# Patient Record
Sex: Male | Born: 1975 | Race: Black or African American | Hispanic: No | Marital: Married | Smoking: Current every day smoker
Health system: Southern US, Community
[De-identification: ages and names within clinical notes are randomized; demographics above are authoritative.]

---

## 2016-12-31 ENCOUNTER — Emergency Department (HOSPITAL_COMMUNITY): Payer: Self-pay

## 2016-12-31 ENCOUNTER — Encounter (HOSPITAL_COMMUNITY): Payer: Self-pay | Admitting: Emergency Medicine

## 2016-12-31 ENCOUNTER — Emergency Department (HOSPITAL_COMMUNITY)
Admission: EM | Admit: 2016-12-31 | Discharge: 2016-12-31 | Disposition: A | Discharge status: Home / Self Care | Payer: Self-pay | Attending: Emergency Medicine | Admitting: Emergency Medicine

## 2016-12-31 DIAGNOSIS — X500XXA Overexertion from strenuous movement or load, initial encounter: Secondary | ICD-10-CM | POA: Insufficient documentation

## 2016-12-31 DIAGNOSIS — M25512 Pain in left shoulder: Secondary | ICD-10-CM | POA: Insufficient documentation

## 2016-12-31 DIAGNOSIS — F17210 Nicotine dependence, cigarettes, uncomplicated: Secondary | ICD-10-CM | POA: Insufficient documentation

## 2016-12-31 DIAGNOSIS — Y999 Unspecified external cause status: Secondary | ICD-10-CM | POA: Insufficient documentation

## 2016-12-31 DIAGNOSIS — Y929 Unspecified place or not applicable: Secondary | ICD-10-CM | POA: Insufficient documentation

## 2016-12-31 DIAGNOSIS — Y9389 Activity, other specified: Secondary | ICD-10-CM | POA: Insufficient documentation

## 2016-12-31 MED ORDER — NAPROXEN 500 MG PO TABS: 500.0000 mg | ORAL_TABLET | Freq: Two times a day (BID) | ORAL | 0 refills | Status: DC

## 2016-12-31 MED ORDER — IBUPROFEN 200 MG PO TABS
600.0000 mg | ORAL_TABLET | Freq: Once | ORAL | Status: AC
  Administered 2016-12-31: 600 mg via ORAL
  Filled 2016-12-31: qty 3

## 2016-12-31 NOTE — ED Provider Notes (Signed)
[facility]-EMERGENCY DEPT Provider Note   CSN: [id number] Arrival date & time: 12/31/16  4098     History   Chief Complaint Chief Complaint  Patient presents with  . Shoulder Pain    HPI Willie Arias is a 41 y.o. male.  HPI   Patient is a 41 year old male with no pertinent past medical history presents the ED with complaint of left shoulder pain. Patient reports having gradually worsening intermittent left shoulder pain for the past 8 months. 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 He note  having tenderness over the top of his left shoulder which she notes is worse with movement. He reports associated intermittent weakness to his left arm due to pain. He also states having a small area of swelling to the back of his left shoulder which she notes has been present for the past 4-5 years and has gradually increased in size. Denies fever, redness, numbness, tingling, chest pain, shortness of breath. History reviewed. No pertinent past medical history.  There are no active problems to display for this patient.   History reviewed. No pertinent surgical history.     Home Medications    Prior to Admission medications   Medication Sig Start Date End Date Taking? Authorizing Provider  acetaminophen (TYLENOL) 500 MG tablet Take 1,000 mg by mouth every 6 (six) hours as needed for mild pain or moderate pain.   Yes [provider]  naproxen (NAPROSYN) 500 MG tablet Take 1 tablet (500 mg total) by mouth 2 (two) times daily. 12/31/16   Barrett Henle, PA-C    Family History No family history on file.  Social History Social History  Substance Use Topics  . Smoking status: Current Every Day Smoker    Packs/day: 0.25    Types: Cigarettes  . Smokeless tobacco: Not on file  . Alcohol use No     Allergies   Patient has no known allergies.   Review of  Systems Review of Systems  Musculoskeletal: Positive for arthralgias (left shoulder).  Neurological: Positive for weakness.  All other systems reviewed and are negative.    Physical Exam Updated Vital Signs BP 115/78   Pulse 63   Temp 98.2 F (36.8 C) (Oral)   Resp 16   Ht 5\' 11"  (1.803 m)   Wt 197 lb 4 oz (89.5 kg)   SpO2 100%   BMI 27.51 kg/m   Physical Exam  Constitutional: He is oriented to person, place, and time. He appears well-developed and well-nourished. No distress.  HENT:  Head: Normocephalic and atraumatic.  Eyes: Conjunctivae and EOM are normal. Right eye exhibits no discharge. Left eye exhibits no discharge. No scleral icterus.  Neck: Normal range of motion. Neck supple.  Cardiovascular: Normal rate, regular rhythm, normal heart sounds and intact distal pulses.   Pulmonary/Chest: Effort normal and breath sounds normal.  Abdominal: Soft. He exhibits no distension.  Musculoskeletal: Normal range of motion. He exhibits tenderness. He exhibits no edema or deformity.       Left shoulder: He exhibits tenderness and bony tenderness (AC joint). He exhibits normal range of motion, no swelling, no effusion, no crepitus, no deformity, no laceration, no pain, normal pulse and normal strength.       Left elbow: Normal.       Left wrist: Normal.       Left upper arm: Normal.       Left forearm:  Normal.       Left hand: Normal.  TTP over left AC joint. Full passive ROM of left shoulder, dec active due to reported pain. 4/5 strength present to left shoulder. FROM of left arm, elbow, forearm, wrist and hand with 5/5 strength. Sensation intact. 2+ radial pulse. Cap refill <2. No swelling, erythema, warmth, deformity or step off noted. Clavicle nontender with no step off or deformity.   Neurological: He is alert and oriented to person, place, and time.  Skin: Skin is warm and dry. He is not diaphoretic.  3x2cm area of mobile, well defined mass present to left posterior shoulder  lateral to scapula. No surrounding swelling, redness, warmth. Nontender.  Nursing note and vitals reviewed.    ED Treatments / Results  Labs (all labs ordered are listed, but only abnormal results are displayed) Labs Reviewed - No data to display  EKG  EKG Interpretation None       Radiology Dg Shoulder Left  Result Date: 12/31/2016 CLINICAL DATA:  Left shoulder pain for months, worsening. EXAM: LEFT SHOULDER - 2+ VIEW COMPARISON:  None. FINDINGS: There is no evidence of fracture or dislocation. There is no evidence of arthropathy or other focal bone abnormality. Soft tissues are unremarkable. IMPRESSION: Negative. Electronically Signed   By: Marnee SpringJonathon  Watts M.D.   On: 12/31/2016 10:06    Procedures Procedures (including critical care time)  Medications Ordered in ED Medications  ibuprofen (ADVIL,MOTRIN) tablet 600 mg (600 mg Oral Given 12/31/16 0936)     Initial Impression / Assessment and Plan / ED Course  I have reviewed the triage vital signs and the nursing notes.  Pertinent labs & imaging results that were available during my care of the patient were reviewed by me and considered in my medical decision making (see chart for details).     Patient presents with gradually worsening left shoulder pain has been present for the past 8 months. Denies any specific injury but reports to having repetitive heavy lifting at his current jobs. VSS. Exam revealed tenderness over left AC joint without swelling, erythema or deformity. 4-5 strength present to left shoulder with decreased active range of motion due to reported pain. Area of swelling appears consistent with lipoma to left upper back; do not suspect abscess or infection. Advised to follow up with PCP. Patient X-Ray negative for obvious fracture or dislocation. Suspect pain may be due to rotator cuff injury. Pain managed in ED. Pt advised to follow up with orthopedics. Conservative therapy recommended and discussed. Patient  will be dc home & is agreeable with above plan.     Final Clinical Impressions(s) / ED Diagnoses   Final diagnoses:  Acute pain of left shoulder    New Prescriptions New Prescriptions   NAPROXEN (NAPROSYN) 500 MG TABLET    Take 1 tablet (500 mg total) by mouth 2 (two) times daily.     Barrett Henleadeau, Analia Zuk Elizabeth, PA-C 12/31/16 1055    Lorre NickAllen, Anthony, MD 01/01/17 1525

## 2016-12-31 NOTE — Discharge Instructions (Signed)
Take your medication as prescribed as for pain relief. I also recommend a senior shoulder for 15-20 minutes 3-4 times daily. I recommend refrain from doing any heavy lifting or straining his activity for the next week until your symptoms have improved. Follow-up with the orthopedic office listed below within the next week if your symptoms have not improved or worsened due to concern for possible rotator cuff tear.

## 2016-12-31 NOTE — ED Triage Notes (Signed)
Pt verbalizes worsening left shoulder pain for months with associated pain with lifting left arm and "lump" getting large to left scapula region.

## 2017-01-03 ENCOUNTER — Encounter (INDEPENDENT_AMBULATORY_CARE_PROVIDER_SITE_OTHER): Payer: Self-pay | Admitting: Family

## 2017-01-03 ENCOUNTER — Ambulatory Visit (INDEPENDENT_AMBULATORY_CARE_PROVIDER_SITE_OTHER): Payer: Self-pay | Admitting: Family

## 2017-01-03 VITALS — Ht 71.0 in | Wt 197.0 lb

## 2017-01-03 DIAGNOSIS — M7542 Impingement syndrome of left shoulder: Secondary | ICD-10-CM

## 2017-01-03 MED ORDER — LIDOCAINE HCL 1 % IJ SOLN
5.0000 mL | INTRAMUSCULAR | Status: AC | PRN
  Administered 2017-01-03: 5 mL

## 2017-01-03 MED ORDER — METHYLPREDNISOLONE ACETATE 40 MG/ML IJ SUSP
40.0000 mg | INTRAMUSCULAR | Status: AC | PRN
  Administered 2017-01-03: 40 mg via INTRA_ARTICULAR

## 2017-01-03 NOTE — Progress Notes (Signed)
Office Visit Note   Patient: Willie Arias           Date of Birth: 08/24/1975           MRN: [id number] Visit Date: 01/03/2017              Requested by: No referring provider defined for this encounter. PCP: Patient, No Pcp Per  Chief Complaint  Patient presents with  . Left Shoulder - Pain      HPI: The patient is a 41 year old gentleman who presents today for evaluation of left shoulder pain. This is chronic. This is been going on for at least 8 months. States that he presented to the emergency department on May 21. Was provided with naproxen which has helped a little bit. Complaining of decreased range of motion due to pain. Is able to lift his arm above his head. He states he is concerned about [address] [place] cuff.  Assessment & Plan: Visit Diagnoses:  1. Impingement syndrome of left shoulder     Plan: we will trial a Depo-Medrol injection.  recommended he continue with the naproxen. States he cannot complete physical therapy due to scheduling restrictions.  Follow-Up Instructions: Return in about 4 weeks (around 01/31/2017), or if symptoms worsen or fail to improve.   Left Shoulder Exam   Tenderness  The patient is experiencing tenderness in the biceps tendon and acromion.  Range of Motion  The patient has normal left shoulder ROM.  Muscle Strength  The patient has normal left shoulder strength.  Tests  Drop Arm: negative Impingement: positive      Patient is alert, oriented, no adenopathy, well-dressed, normal affect, normal respiratory effort.   Imaging: No results found.  Labs: No results found for: HGBA1C, ESRSEDRATE, CRP, LABURIC, REPTSTATUS, GRAMSTAIN, CULT, LABORGA  Orders:  No orders of the defined types were placed in this encounter.  No orders of the defined types were placed in this encounter.    Procedures: Large Joint Inj Date/Time: 01/03/2017 4:32 PM Performed by: Adonis HugueninZAMORA, Ioana Louks R Authorized by: Barnie DelZAMORA, Aleeya Veitch R   Consent Given  by:  Patient Site marked: the procedure site was marked   Timeout: prior to procedure the correct patient, procedure, and site was verified   Indications:  Pain and diagnostic evaluation Location:  Shoulder Site:  R subacromial bursa Prep: patient was prepped and draped in usual sterile fashion   Needle Size:  22 G Needle Length:  1.5 inches Ultrasound Guidance: No   Fluoroscopic Guidance: No   Arthrogram: No   Medications:  5 mL lidocaine 1 %; 40 mg methylPREDNISolone acetate 40 MG/ML Aspiration Attempted: No   Patient tolerance:  Patient tolerated the procedure well with no immediate complications    Clinical Data: No additional findings.  ROS:  All other systems negative, except as noted in the HPI. Review of Systems  Constitutional: Negative for chills and fever.  Musculoskeletal: Positive for arthralgias. Negative for myalgias.    Objective: Vital Signs: Ht 5\' 11"  (1.803 m)   Wt 197 lb (89.4 kg)   BMI 27.48 kg/m   Specialty Comments:  No specialty comments available.  PMFS History: There are no active problems to display for this patient.  No past medical history on file.  No family history on file.  No past surgical history on file. Social History   Occupational History  . Not on file.   Social History Main Topics  . Smoking status: Current Every Day Smoker    Packs/day: 0.25  Types: Cigarettes  . Smokeless tobacco: Never Used  . Alcohol use No  . Drug use: No  . Sexual activity: Not on file

## 2017-01-25 ENCOUNTER — Ambulatory Visit
Admission: RE | Admit: 2017-01-25 | Discharge: 2017-01-25 | Disposition: A | Discharge status: Home / Self Care | Payer: No Typology Code available for payment source | Source: Ambulatory Visit | Attending: Family | Admitting: Family

## 2017-01-25 DIAGNOSIS — M7542 Impingement syndrome of left shoulder: Secondary | ICD-10-CM

## 2017-01-31 ENCOUNTER — Encounter (INDEPENDENT_AMBULATORY_CARE_PROVIDER_SITE_OTHER): Payer: Self-pay | Admitting: Orthopedic Surgery

## 2017-01-31 ENCOUNTER — Ambulatory Visit (INDEPENDENT_AMBULATORY_CARE_PROVIDER_SITE_OTHER): Payer: Self-pay | Admitting: Orthopedic Surgery

## 2017-01-31 DIAGNOSIS — M25512 Pain in left shoulder: Secondary | ICD-10-CM

## 2017-01-31 NOTE — Progress Notes (Signed)
Office Visit Note   Patient: Willie Arias           Date of Birth: 12/15/1975           MRN: [id number] Visit Date: 01/31/2017 Requested by: No referring provider defined for this encounter. PCP: Patient, No Pcp Per  Subjective: Chief Complaint  Patient presents with  . Left Shoulder - Pain    HPI: Stancil is a 41 year old patient with left shoulder pain.  Denies any history of injury.  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  Shoulder pain is getting worse on the left-hand side.  Localizes the pain to the superior aspect of the shoulder.  States he has a lipoma posterior lateral aspect around the acromion.  He has been working but is been lifting less than 10 pounds.  His heart in the sleep on that left side.  He is right-hand-dominant.              ROS: All systems reviewed are negative as they relate to the chief complaint within the history of present illness.  Patient denies  fevers or chills.   Assessment & Plan: Visit Diagnoses:  1. 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  This scan is reviewed with the patient.  Plan at this time is for topical anti-inflammatory to be tried.  Prescription sent and samples provided.  Next step would be an injection into the acromioclavicular joint under ultrasound guidance.  We're considering doing that today but he is too concerned about things and needles for that intervention today.  I don't think that there is any pressing need for surgical intervention unless he fails nonoperative measures.  Should the anti-inflammatory topical not worked in injection would be the next step.  He'll return for that.  I'll see him back as needed  Follow-Up Instructions:  Return if symptoms worsen or fail to improve.   Orders:  No orders of the defined types were placed in this encounter.  No orders of the defined types were placed in this encounter.     Procedures: No procedures performed   Clinical Data: No additional findings.  Objective: Vital Signs: There were no vitals taken for this visit.  Physical Exam:   Constitutional: Patient appears well-developed HEENT:  Head: Normocephalic Eyes:EOM are normal Neck: Normal range of motion Cardiovascular: Normal rate Pulmonary/chest: Effort normal Neurologic: Patient is alert Skin: Skin is warm Psychiatric: Patient has normal mood and affect    Ortho Exam: Left shoulder exam demonstrates full active and passive range of motion but with fairly significant acromioclavicular joint tenderness grinding and crepitus on the left compared to the right.  Rotator cuff strength is intact.  Radial pulses intact.  He does have pain with crossarm abduction but good rotator cuff strength isolated infraspinatus supraspinatus and subscap muscle testing patient has no restriction of external rotation at 15 abduction left versus right no other masses lymph adenopathy or skin changes noted in the shoulder girdle region.  Specialty Comments:  No specialty comments available.  Imaging: No results found.   PMFS History: There are no active problems to display for this patient.  No past medical history on file.  No family history on file.  No past surgical history  on file. Social History   Occupational History  . Not on file.   Social History Main Topics  . Smoking status: Current Every Day Smoker    Packs/day: 0.25    Types: Cigarettes  . Smokeless tobacco: Never Used  . Alcohol use No  . Drug use: No  . Sexual activity: Not on file

## 2017-02-28 ENCOUNTER — Ambulatory Visit (INDEPENDENT_AMBULATORY_CARE_PROVIDER_SITE_OTHER): Payer: Self-pay | Admitting: Orthopedic Surgery

## 2017-03-28 ENCOUNTER — Emergency Department (HOSPITAL_COMMUNITY): Payer: No Typology Code available for payment source

## 2017-03-28 ENCOUNTER — Encounter (HOSPITAL_COMMUNITY): Payer: Self-pay | Admitting: Emergency Medicine

## 2017-03-28 ENCOUNTER — Emergency Department (HOSPITAL_COMMUNITY)
Admission: EM | Admit: 2017-03-28 | Discharge: 2017-03-28 | Disposition: A | Discharge status: Home / Self Care | Payer: No Typology Code available for payment source | Attending: Emergency Medicine | Admitting: Emergency Medicine

## 2017-03-28 DIAGNOSIS — Z79899 Other long term (current) drug therapy: Secondary | ICD-10-CM | POA: Insufficient documentation

## 2017-03-28 DIAGNOSIS — Y9389 Activity, other specified: Secondary | ICD-10-CM | POA: Diagnosis not present

## 2017-03-28 DIAGNOSIS — Y92410 Unspecified street and highway as the place of occurrence of the external cause: Secondary | ICD-10-CM | POA: Diagnosis not present

## 2017-03-28 DIAGNOSIS — M25532 Pain in left wrist: Secondary | ICD-10-CM | POA: Insufficient documentation

## 2017-03-28 DIAGNOSIS — Y999 Unspecified external cause status: Secondary | ICD-10-CM | POA: Diagnosis not present

## 2017-03-28 DIAGNOSIS — F17210 Nicotine dependence, cigarettes, uncomplicated: Secondary | ICD-10-CM | POA: Diagnosis not present

## 2017-03-28 MED ORDER — MELOXICAM 7.5 MG PO TABS: 7.5000 mg | ORAL_TABLET | Freq: Every day | ORAL | 0 refills | Status: DC
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
## 2017-03-28 NOTE — ED Notes (Signed)
Bed: WTR6 Expected date:  Expected time:  Means of arrival:  Comments: 

## 2017-03-28 NOTE — ED Notes (Signed)
Bed: WTR5 Expected date:  Expected time:  Means of arrival:  Comments: 

## 2017-03-28 NOTE — Discharge Instructions (Signed)
Take Mobic once a day. Do not take other anti-inflammatories at the same time (Advil, Motrin, naproxen, Aleve, ibuprofen). You may supplement with Tylenol as needed. You may use the Ace wrap on your wrist as needed for support and pain control. Rest and ice your wrist as much as possible, icing it for 20 minutes at a time multiple times a day. Follow-up with [facility] in one week if you're still experiencing pain. Turned to the emergency room if you develop vision changes, numbness, weakness, vomiting, or any new or worsening symptoms.

## 2018-02-15 DIAGNOSIS — K029 Dental caries, unspecified: Secondary | ICD-10-CM | POA: Insufficient documentation

## 2018-02-15 DIAGNOSIS — F17210 Nicotine dependence, cigarettes, uncomplicated: Secondary | ICD-10-CM | POA: Insufficient documentation

## 2018-02-15 DIAGNOSIS — K047 Periapical abscess without sinus: Secondary | ICD-10-CM | POA: Insufficient documentation

## 2018-02-16 ENCOUNTER — Other Ambulatory Visit: Payer: Self-pay

## 2018-02-16 ENCOUNTER — Encounter (HOSPITAL_COMMUNITY): Payer: Self-pay | Admitting: *Deleted

## 2018-02-16 ENCOUNTER — Emergency Department (HOSPITAL_COMMUNITY)
Admission: EM | Admit: 2018-02-16 | Discharge: 2018-02-16 | Disposition: A | Discharge status: Home / Self Care | Payer: No Typology Code available for payment source | Attending: Emergency Medicine | Admitting: Emergency Medicine

## 2018-02-16 DIAGNOSIS — K047 Periapical abscess without sinus: Secondary | ICD-10-CM

## 2018-02-16 DIAGNOSIS — K029 Dental caries, unspecified: Secondary | ICD-10-CM

## 2018-02-16 MED ORDER — CLINDAMYCIN HCL 150 MG PO CAPS: 450.0000 mg | ORAL_CAPSULE | Freq: Three times a day (TID) | ORAL | 0 refills | Status: DC

## 2018-02-16 MED ORDER — ACETAMINOPHEN 325 MG PO TABS
325.0000 mg | ORAL_TABLET | Freq: Once | ORAL | Status: AC
  Administered 2018-02-16: 325 mg via ORAL
  Filled 2018-02-16: qty 1

## 2018-02-16 MED ORDER — CLINDAMYCIN HCL 300 MG PO CAPS
450.0000 mg | ORAL_CAPSULE | Freq: Once | ORAL | Status: AC
  Administered 2018-02-16: 04:00:00 450 mg via ORAL
  Filled 2018-02-16: qty 1

## 2018-02-16 MED ORDER — BUPIVACAINE-EPINEPHRINE (PF) 0.5% -1:200000 IJ SOLN
1.8000 mL | Freq: Once | INTRAMUSCULAR | Status: AC
  Administered 2018-02-16: 1.8 mL
  Filled 2018-02-16: qty 1.8

## 2018-02-16 MED ORDER — OXYCODONE-ACETAMINOPHEN 5-325 MG PO TABS
2.0000 | ORAL_TABLET | Freq: Once | ORAL | Status: AC
  Administered 2018-02-16: 2 via ORAL
  Filled 2018-02-16: qty 2

## 2018-02-16 NOTE — ED Triage Notes (Signed)
Pt c/o abscess to right lower jaw that began on Wednesday.  Facial edema began Saturday.

## 2018-02-16 NOTE — ED Provider Notes (Signed)
[facility]-EMERGENCY DEPT Provider Note   CSN: [id number] Arrival date & time: 02/15/18  2355     History   Chief Complaint Chief Complaint  Patient presents with  . Abscess    dental    HPI Willie Riley LamDouglas [name] is a 42 y.o. male with a hx of no major medical problems presents to the Emergency Department complaining of gradual, persistent, progressively worsening right lower dental pain onset 5 days ago. Associated symptoms include facial swelling and increased pain today.  Pt has been taking ibuprofen with relief until tonight.  Pt denise fever, chills, headache, neck pain, neck stiffness, difficulty breathing, difficulty swallowing.  Pt reports eating and drinking makes the pain worse.  Pt does have a dentist.  He was seen approx 1 mo ago with recommendation for tooth extraction, but patient was waiting.     The history is provided by the patient, medical records and a significant other. No language interpreter was used.    History reviewed. No pertinent past medical history.  There are no active problems to display for this patient.   History reviewed. No pertinent surgical history.      Home Medications    Prior to Admission medications   Medication Sig Start Date End Date Taking? Authorizing Provider  ibuprofen (ADVIL,MOTRIN) 400 MG tablet Take 800-1,200 mg by mouth every 6 (six) hours as needed for moderate pain.   Yes [provider]  clindamycin (CLEOCIN) 150 MG capsule Take 3 capsules (450 mg total) by mouth 3 (three) times daily. 02/16/18   Nariah Morgano, Dahlia ClientHannah, PA-C    Family History No family history on file.  Social History Social History   Tobacco Use  . Smoking status: Current Every Day Smoker    Packs/day: 0.25    Types: Cigarettes  . Smokeless tobacco: Never Used  Substance Use Topics  . Alcohol use: Yes    Comment: weekends  . Drug use: No     Allergies   Patient has no known allergies.   Review of  Systems Review of Systems  Constitutional: Negative for appetite change, chills and fever.  HENT: Positive for dental problem and facial swelling. Negative for drooling, ear pain, nosebleeds, postnasal drip, rhinorrhea and trouble swallowing.   Eyes: Negative for pain and redness.  Respiratory: Negative for cough and wheezing.   Cardiovascular: Negative for chest pain.  Gastrointestinal: Negative for abdominal pain, nausea and vomiting.  Musculoskeletal: Negative for neck pain and neck stiffness.  Skin: Negative for color change and rash.  Neurological: Positive for headaches. Negative for weakness and light-headedness.  All other systems reviewed and are negative.    Physical Exam Updated Vital Signs BP (!) 147/80   Pulse 66   Temp 99.3 F (37.4 C) (Oral) Comment: last took ibuprofen @ 18:00  Resp 15   Ht 5\' 10"  (1.778 m)   Wt 93.9 kg (207 lb)   SpO2 100%   BMI 29.70 kg/m   Physical Exam  Constitutional: He appears well-developed and well-nourished.  HENT:  Head: Normocephalic.  Right Ear: Tympanic membrane, external ear and ear canal normal.  Left Ear: Tympanic membrane, external ear and ear canal normal.  Nose: Nose normal. Right sinus exhibits no maxillary sinus tenderness and no frontal sinus tenderness. Left sinus exhibits no maxillary sinus tenderness and no frontal sinus tenderness.  Mouth/Throat: Uvula is midline, oropharynx is clear and moist and mucous membranes are normal. No oral lesions. Abnormal dentition. Dental caries present. No uvula swelling or lacerations. No  oropharyngeal exudate, posterior oropharyngeal edema, posterior oropharyngeal erythema or tonsillar abscesses.  Multiple dental caries throughout.  Significant tenderness to palpation of tooth #27 with evidence of abscess.  Swelling and fluctuance between the gingiva and buccal mucosa. No fluctuance or induration to the floor of the mouth  Eyes: Pupils are equal, round, and reactive to light.  Conjunctivae are normal. Right eye exhibits no discharge. Left eye exhibits no discharge.  Neck: Normal range of motion. Neck supple.  No stridor Handling secretions without difficulty No nuchal rigidity No cervical lymphadenopathy  Cardiovascular: Normal rate, regular rhythm and normal heart sounds.  Pulmonary/Chest: Effort normal. No respiratory distress.  Equal chest rise  Abdominal: Soft. Bowel sounds are normal. He exhibits no distension. There is no tenderness.  Lymphadenopathy:       Head (right side): Submental, submandibular and tonsillar adenopathy present.       Head (left side): No submental, no submandibular and no tonsillar adenopathy present.    He has no cervical adenopathy.  Neurological: He is alert.  Skin: Skin is warm and dry.  Psychiatric: He has a normal mood and affect.  Nursing note and vitals reviewed.    ED Treatments / Results   Procedures .Marland KitchenIncision and Drainage Date/Time: 02/16/2018 5:55 AM Performed by: Dierdre Forth, PA-C Authorized by: Dierdre Forth, PA-C   Consent:    Consent obtained:  Verbal   Consent given by:  Patient   Risks discussed:  Bleeding, incomplete drainage, pain, infection and damage to other organs   Alternatives discussed:  No treatment Location:    Type:  Abscess   Location:  Mouth   Mouth location:  Alveolar process Anesthesia (see MAR for exact dosages):    Anesthesia method:  Local infiltration   Local anesthetic:  Bupivacaine 0.5% WITH epi Procedure type:    Complexity:  Simple Procedure details:    Needle aspiration: no     Incision types:  Single straight   Scalpel blade:  11   Wound management:  Probed and deloculated and irrigated with saline   Drainage:  Bloody and purulent   Drainage amount:  Moderate   Wound treatment:  Wound left open   Packing materials:  None Post-procedure details:    Patient tolerance of procedure:  Tolerated well, no immediate complications   (including critical care  time)  Medications Ordered in ED Medications  clindamycin (CLEOCIN) capsule 450 mg (450 mg Oral Given 02/16/18 0340)  oxyCODONE-acetaminophen (PERCOCET/ROXICET) 5-325 MG per tablet 2 tablet (2 tablets Oral Given 02/16/18 0340)  bupivacaine-epinephrine (MARCAINE W/ EPI) 0.5% -1:200000 injection 1.8 mL (1.8 mLs Infiltration Given 02/16/18 0342)     Initial Impression / Assessment and Plan / ED Course  I have reviewed the triage vital signs and the nursing notes.  Pertinent labs & imaging results that were available during my care of the patient were reviewed by me and considered in my medical decision making (see chart for details).     Patient presents with dental pain and swelling.  His he is afebrile without tachycardia.  No evidence of sepsis.  No evidence of peritonsillar abscess, Ludwig angina or retropharyngeal abscess.  He is tolerating secretions and p.o. fluids before and after incision and drainage.  Patient does have a fluctuant abscess to the right lower gingiva.  Incision and drainage without complication.  Patient will be discharged home on clindamycin.  Patient noted to be hypertensive in the emergency department.  I suspect this is secondary to his pain.  No signs of  hypertensive urgency.  Discussed with patient the need for close follow-up and management by their primary care physician.    Final Clinical Impressions(s) / ED Diagnoses   Final diagnoses:  Dental abscess  Pain due to dental caries    ED Discharge Orders        Ordered    clindamycin (CLEOCIN) 150 MG capsule  3 times daily     02/16/18 0600       Algie Cales, Boyd Kerbs 02/16/18 0601    Melene Plan, DO 02/16/18 1610

## 2018-02-16 NOTE — Discharge Instructions (Addendum)
1. Medications: Clindamycin, alterate tylenol and ibuprofen for pain control, usual home medications 2. Treatment: rest, drink plenty of fluids, take medications as prescribed 3. Follow Up: Please followup with dentistry within 1 week for discussion of your diagnoses and further evaluation after today's visit; if you do not have a primary care doctor use the resource guide provided to find one; Return to the ER for high fevers, difficulty breathing, difficulty swallowing or other concerning symptoms
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
## 2019-09-29 ENCOUNTER — Emergency Department (HOSPITAL_COMMUNITY)
Admission: EM | Admit: 2019-09-29 | Discharge: 2019-09-29 | Disposition: A | Discharge status: Home / Self Care | Payer: No Typology Code available for payment source | Attending: Emergency Medicine | Admitting: Emergency Medicine

## 2019-09-29 ENCOUNTER — Other Ambulatory Visit: Payer: Self-pay

## 2019-09-29 ENCOUNTER — Encounter (HOSPITAL_COMMUNITY): Payer: Self-pay | Admitting: Emergency Medicine

## 2019-09-29 DIAGNOSIS — F17210 Nicotine dependence, cigarettes, uncomplicated: Secondary | ICD-10-CM | POA: Insufficient documentation

## 2019-09-29 DIAGNOSIS — H9201 Otalgia, right ear: Secondary | ICD-10-CM

## 2019-09-29 DIAGNOSIS — L0291 Cutaneous abscess, unspecified: Secondary | ICD-10-CM

## 2019-09-29 DIAGNOSIS — Z23 Encounter for immunization: Secondary | ICD-10-CM | POA: Insufficient documentation

## 2019-09-29 DIAGNOSIS — H6001 Abscess of right external ear: Secondary | ICD-10-CM | POA: Insufficient documentation

## 2019-09-29 MED ORDER — LIDOCAINE HCL (PF) 1 % IJ SOLN
5.0000 mL | Freq: Once | INTRAMUSCULAR | Status: AC
  Administered 2019-09-29: 5 mL
  Filled 2019-09-29: qty 30

## 2019-09-29 MED ORDER — OXYCODONE-ACETAMINOPHEN 5-325 MG PO TABS
1.0000 | ORAL_TABLET | Freq: Once | ORAL | Status: AC
  Administered 2019-09-29: 1 via ORAL
  Filled 2019-09-29: qty 1

## 2019-09-29 MED ORDER — TETANUS-DIPHTH-ACELL PERTUSSIS 5-2.5-18.5 LF-MCG/0.5 IM SUSP
0.5000 mL | Freq: Once | INTRAMUSCULAR | Status: AC
  Administered 2019-09-29: 20:00:00 0.5 mL via INTRAMUSCULAR
  Filled 2019-09-29: qty 0.5

## 2019-09-29 MED ORDER — DOXYCYCLINE HYCLATE 100 MG PO CAPS: 100.0000 mg | ORAL_CAPSULE | Freq: Two times a day (BID) | ORAL | 0 refills | Status: DC

## 2019-09-29 MED ORDER — DOXYCYCLINE HYCLATE 100 MG PO CAPS: 100.0000 mg | ORAL_CAPSULE | Freq: Two times a day (BID) | ORAL | 0 refills | Status: AC

## 2019-09-29 NOTE — ED Triage Notes (Signed)
Pt c/o right ear lobe swelling and pain for couple days. Reports some brownish discharge coming out the other day when squeezed it.

## 2019-09-29 NOTE — Discharge Instructions (Addendum)
Please take doxycycline as prescribed.  You will take this twice daily.  Please take Tylenol or Profen as we discussed.  I have suggested dosages below.  Please use Tylenol or ibuprofen for pain.  You may use 600 mg ibuprofen every 6 hours or 1000 mg of Tylenol every 6 hours.  You may choose to alternate between the 2.  This would be most effective.  Not to exceed 4 g of Tylenol within 24 hours.  Not to exceed 3200 mg ibuprofen 24 hours.   Please continue to do warm compresses at least 3 times a day.

## 2019-09-29 NOTE — ED Provider Notes (Signed)
[facility]-EMERGENCY DEPT Provider Note   CSN: [id number] Arrival date & time: 09/29/19  1811     History Chief Complaint  Patient presents with  . ear lobe swelling    right    Willie Archuleta [name] is a 44 y.o. male.  HPI  Patient presents for right ear pain.  Patient describes pain as constant, worsening, achy right ear pain that is severe, worse with touch and warm to touch.  This has been present since 2 days ago--patient states that history of abscesses other than dental abscess one year ago. Pt states no fevers, fatigue, weakness, headache, dizziness.   Patient states that he has no medical problems such as diabetes, HIV or chronic steroid use.     History reviewed. No pertinent past medical history.  There are no problems to display for this patient.   History reviewed. No pertinent surgical history.     No family history on file.  Social History   Tobacco Use  . Smoking status: Current Every Day Smoker    Packs/day: 0.25    Types: Cigarettes  . Smokeless tobacco: Never Used  Substance Use Topics  . Alcohol use: Yes    Comment: weekends  . Drug use: No    Home Medications Prior to Admission medications   Medication Sig Start Date End Date Taking? Authorizing Provider  clindamycin (CLEOCIN) 150 MG capsule Take 3 capsules (450 mg total) by mouth 3 (three) times daily. 02/16/18   Muthersbaugh, Dahlia Client, PA-C  doxycycline (VIBRAMYCIN) 100 MG capsule Take 1 capsule (100 mg total) by mouth 2 (two) times daily for 7 days. 09/29/19 10/06/19  Gailen Shelter, PA  ibuprofen (ADVIL,MOTRIN) 400 MG tablet Take 800-1,200 mg by mouth every 6 (six) hours as needed for moderate pain.    [provider]    Allergies    Patient has no known allergies.  Review of Systems   Review of Systems  Constitutional: Negative for chills, fatigue and fever.  HENT: Positive for ear pain and facial swelling (Swelling of ear). Negative for congestion.     Respiratory: Negative for shortness of breath.   Cardiovascular: Negative for chest pain.  Gastrointestinal: Negative for abdominal pain.  Musculoskeletal: Negative for neck pain.    Physical Exam Updated Vital Signs BP 136/89 (BP Location: Right Arm)   Pulse 69   Temp 98.2 F (36.8 C)   Resp 16   SpO2 100%   Physical Exam Vitals and nursing note reviewed.  Constitutional:      General: He is not in acute distress.    Appearance: Normal appearance. He is not ill-appearing.  HENT:     Head: Normocephalic and atraumatic.  Eyes:     General: No scleral icterus.       Right eye: No discharge.        Left eye: No discharge.     Conjunctiva/sclera: Conjunctivae normal.  Neck:     Comments: No anterior or posterior cervical lymphadenopathy Pulmonary:     Effort: Pulmonary effort is normal.     Breath sounds: No stridor.  Skin:    Comments: Right ear with swelling, redness and TTP of the lobule. The area is fluctuant.   Neurological:     Mental Status: He is alert and oriented to person, place, and time. Mental status is at baseline.           ED Results / Procedures / Treatments   Labs (all labs ordered are listed, but only  abnormal results are displayed) Labs Reviewed - No data to display  EKG None  Radiology No results found.  Procedures .Marland KitchenIncision and Drainage  Date/Time: 09/29/2019 9:05 PM Performed by: Tedd Sias, PA Authorized by: Tedd Sias, PA   Consent:    Consent obtained:  Verbal   Consent given by:  Patient   Risks discussed:  Bleeding, incomplete drainage, pain and damage to other organs   Alternatives discussed:  No treatment Universal protocol:    Procedure explained and questions answered to patient or proxy's satisfaction: yes     Relevant documents present and verified: yes     Test results available and properly labeled: yes     Imaging studies available: yes     Required blood products, implants, devices, and special  equipment available: yes     Site/side marked: yes     Immediately prior to procedure a time out was called: yes     Patient identity confirmed:  Verbally with patient Location:    Type:  Abscess   Size:  1.5 cm   Location:  Head   Head location:  R external ear Pre-procedure details:    Skin preparation:  Betadine and Chloraprep Anesthesia (see MAR for exact dosages):    Anesthesia method:  Local infiltration   Local anesthetic:  Lidocaine 1% WITH epi Procedure type:    Complexity:  Simple Procedure details:    Needle aspiration: yes     Needle size:  18 G   Wound management:  Irrigated with saline and extensive cleaning (2 cc were injected into lobule abscess and then reaspirated.)   Drainage:  Purulent (2 cc of purulent drainage)   Drainage amount:  Moderate   Wound treatment:  Wound left open   Packing materials:  None Post-procedure details:    Patient tolerance of procedure:  Tolerated well, no immediate complications Comments:     Patient tolerated procedure well.    (including critical care time)  Medications Ordered in ED Medications  oxyCODONE-acetaminophen (PERCOCET/ROXICET) 5-325 MG per tablet 1 tablet (1 tablet Oral Given 09/29/19 1940)  Tdap (BOOSTRIX) injection 0.5 mL (0.5 mLs Intramuscular Given 09/29/19 1941)  lidocaine (PF) (XYLOCAINE) 1 % injection 5 mL (5 mLs Infiltration Given by Other 09/29/19 1941)    ED Course  I have reviewed the triage vital signs and the nursing notes.  Pertinent labs & imaging results that were available during my care of the patient were reviewed by me and considered in my medical decision making (see chart for details).    MDM Rules/Calculators/A&P                      Patient is well-appearing 44 year old male with a abscess to the right ear.  He states it has been worsening over the past 2 days.  He denies any systemic symptoms and his vitals within normal limits.  He has reassuring physical exam apart from his right ear  abscess.  He has noticed cervical lymphadenopathy.  No meningismus or other concerning symptoms are due to his infection.  He has no change in his hearing.  Discussed pros and cons of incision and drainage versus needle aspiration.  Patient states that he would prefer to have needle aspiration.  I aspirated 2 cc of purulent fluid from the lobule of his earlobe.  I irrigated with 2 cc of saline and we aspirated 2 cc back.  I provided him with doxycycline which she will take for the next  7 days.  He will follow-up with ENT as this was unusual place for him to have infection.  I made this referral at the recommendation of Dr. Madilyn Hook with whom I discussed presentation, physical exam, vitals and history of this patient.  I made changes my plan is recommended by Dr. Madilyn Hook.  Discussed with patient's wife over the phone plan of care and discharge instructions.  He is understanding of plan and well-appearing this time.  Vitals are within normal limits the entirety of his visit.   Final Clinical Impression(s) / ED Diagnoses Final diagnoses:  Abscess  Right ear pain    Rx / DC Orders ED Discharge Orders         Ordered    doxycycline (VIBRAMYCIN) 100 MG capsule  2 times daily,   Status:  Discontinued     09/29/19 2012    doxycycline (VIBRAMYCIN) 100 MG capsule  2 times daily     09/29/19 2059           Solon Augusta O'Neill, [place] 09/29/19 2110    Tilden Fossa, MD 09/30/19 [phone]

## 2019-11-11 ENCOUNTER — Other Ambulatory Visit: Payer: Self-pay | Admitting: Otolaryngology

## 2019-11-11 DIAGNOSIS — Q180 Sinus, fistula and cyst of branchial cleft: Secondary | ICD-10-CM

## 2019-12-18 ENCOUNTER — Encounter: Payer: Self-pay | Admitting: Gastroenterology

## 2020-01-20 ENCOUNTER — Ambulatory Visit: Payer: No Typology Code available for payment source | Admitting: Internal Medicine

## 2021-01-21 ENCOUNTER — Emergency Department (HOSPITAL_COMMUNITY)
Admission: EM | Admit: 2021-01-21 | Discharge: 2021-01-21 | Disposition: A | Discharge status: Home / Self Care | Payer: No Typology Code available for payment source | Attending: Emergency Medicine | Admitting: Emergency Medicine

## 2021-01-21 ENCOUNTER — Other Ambulatory Visit: Payer: Self-pay

## 2021-01-21 ENCOUNTER — Encounter (HOSPITAL_COMMUNITY): Payer: Self-pay

## 2021-01-21 DIAGNOSIS — F17210 Nicotine dependence, cigarettes, uncomplicated: Secondary | ICD-10-CM | POA: Insufficient documentation

## 2021-01-21 DIAGNOSIS — K029 Dental caries, unspecified: Secondary | ICD-10-CM | POA: Insufficient documentation

## 2021-01-21 DIAGNOSIS — R202 Paresthesia of skin: Secondary | ICD-10-CM | POA: Insufficient documentation

## 2021-01-21 DIAGNOSIS — K047 Periapical abscess without sinus: Secondary | ICD-10-CM | POA: Insufficient documentation

## 2021-01-21 MED ORDER — AMOXICILLIN-POT CLAVULANATE 875-125 MG PO TABS: 1.0000 | ORAL_TABLET | Freq: Two times a day (BID) | ORAL | 0 refills | Status: DC

## 2021-01-21 MED ORDER — ACETAMINOPHEN 500 MG PO TABS
1000.0000 mg | ORAL_TABLET | Freq: Once | ORAL | Status: AC
  Administered 2021-01-21: 1000 mg via ORAL
  Filled 2021-01-21: qty 2

## 2021-01-21 MED ORDER — HYDROCODONE-ACETAMINOPHEN 5-325 MG PO TABS: 1.0000 | ORAL_TABLET | Freq: Four times a day (QID) | ORAL | 0 refills | Status: DC | PRN

## 2021-01-21 MED ORDER — AMOXICILLIN-POT CLAVULANATE 875-125 MG PO TABS
1.0000 | ORAL_TABLET | Freq: Once | ORAL | Status: AC
  Administered 2021-01-21: 1 via ORAL
  Filled 2021-01-21: qty 1

## 2021-01-21 MED ORDER — AMOXICILLIN-POT CLAVULANATE 875-125 MG PO TABS: 1.0000 | ORAL_TABLET | Freq: Two times a day (BID) | ORAL | 0 refills | Status: AC

## 2021-01-21 MED ORDER — OXYCODONE HCL 5 MG PO TABS
5.0000 mg | ORAL_TABLET | Freq: Once | ORAL | Status: AC
  Administered 2021-01-21: 5 mg via ORAL
  Filled 2021-01-21: qty 1

## 2021-01-21 MED ORDER — HYDROCODONE-ACETAMINOPHEN 5-325 MG PO TABS: 1.0000 | ORAL_TABLET | Freq: Four times a day (QID) | ORAL | 0 refills | Status: AC | PRN

## 2021-01-21 NOTE — Discharge Instructions (Signed)
Please read and follow all provided instructions.  Your diagnoses today include:  1. Dental infection     The exam and treatment you received today has been provided on an emergency basis only. This is not a substitute for complete medical or dental care.  Tests performed today include: Vital signs. See below for your results today.   Medications prescribed:  Augmentin - antibiotic  You have been prescribed an antibiotic medicine: take the entire course of medicine even if you are feeling better. Stopping early can cause the antibiotic not to work.  Vicodin (hydrocodone/acetaminophen) - narcotic pain medication  DO NOT drive or perform any activities that require you to be awake and alert because this medicine can make you drowsy. BE VERY CAREFUL not to take multiple medicines containing Tylenol (also called acetaminophen). Doing so can lead to an overdose which can damage your liver and cause liver failure and possibly death.   Take any prescribed medications only as directed.  Home care instructions:  Follow any educational materials contained in this packet.  Follow-up instructions: Please follow-up with your dentist for further evaluation of your symptoms.   Dental Assistance: See attached dental referral and/or resource guide.   Return instructions:  Please return to the Emergency Department if you experience worsening symptoms. Please return if you develop a fever, you develop more swelling in your face or neck, you have trouble breathing or swallowing food. Please return if you have any other emergent concerns.  Additional Information:  Your vital signs today were: BP (!) 151/119 (BP Location: Right Arm)   Pulse (!) 56   Temp 100.3 F (37.9 C) (Oral)   Resp 16   Ht 5\' 11"  (1.803 m)   Wt 99.8 kg   SpO2 98%   BMI 30.68 kg/m  If your blood pressure (BP) was elevated above 135/85 this visit, please have this repeated by your doctor within one  month. --------------

## 2021-01-21 NOTE — ED Provider Notes (Signed)
Emergency Medicine Provider Triage Evaluation Note  [name] , a 45 y.o. male  was evaluated in triage.  Pt complains of dental pain.  He chipped a tooth on his bottom right jaw 2 days ago.  Since then he has had fevers and chills.  He is also complaining of swelling to the right of his lower jaw.  He also is having some tingling to his lips and that he is less able to feel the right side of his mouth.  He has not noticed any drainage from the tooth, but it is extremely painful.  Review of Systems  Positive: Pain, fever, chills, facial numbness, facial swelling Negative: Trauma, nausea, vomiting  Physical Exam  BP (!) 146/103 (BP Location: Right Arm)   Pulse 62   Temp 100.3 F (37.9 C) (Oral)   Resp 16   Ht 5\' 11"  (1.803 m)   Wt 99.8 kg   SpO2 100%   BMI 30.68 kg/m  Gen:   Awake, no distress   Resp:  Normal effort  MSK:   Moves extremities without difficulty  Other:  Tooth is fractured, part of it is still in the socket.  He does have some induration to the right part of his jaw.  He is extremely tender to palpation, but there is no sublingual tenderness or swelling.  I am unable to appreciate an abscess in the socket..  Medical Decision Making  Medically screening exam initiated at 7:37 PM.  Appropriate orders placed.  Willie Arias [name] was informed that the remainder of the evaluation will be completed by another provider, this initial triage assessment does not replace that evaluation, and the importance of remaining in the ED until their evaluation is complete.    [place], PA-C 01/21/21 1940    03/23/21, MD 01/22/21 [phone]

## 2021-01-21 NOTE — ED Provider Notes (Signed)
[facility]-EMERGENCY DEPT Provider Note   CSN: [id number] Arrival date & time: 01/21/21  1917     History No chief complaint on file.   Willie Sawaya [name] is a 45 y.o. male.  Patient presents the emergency department for evaluation of right mandibular pain and swelling starting about 2 days ago when he chipped a tooth.  Patient has multiple pre-existing cavities.  He has noted some mild facial swelling.  He has been using over-the-counter medications without much improvement.  No neck pain, difficulty breathing or swallowing.  No history of diabetes or immunocompromise.  The onset of this condition was acute. The course is constant. Aggravating factors: none. Alleviating factors: none.  He reports numbness and tingling into the right lower lip and face. No facial weakness.       History reviewed. No pertinent past medical history.  There are no problems to display for this patient.   History reviewed. No pertinent surgical history.     History reviewed. No pertinent family history.  Social History   Tobacco Use   Smoking status: Every Day    Packs/day: 0.25    Pack years: 0.00    Types: Cigarettes   Smokeless tobacco: Never  Substance Use Topics   Alcohol use: Yes    Comment: weekends   Drug use: No    Home Medications Prior to Admission medications   Medication Sig Start Date End Date Taking? Authorizing Provider  amoxicillin-clavulanate (AUGMENTIN) 875-125 MG tablet Take 1 tablet by mouth every 12 (twelve) hours. 01/21/21  Yes Renne Crigler, PA-C  HYDROcodone-acetaminophen (NORCO/VICODIN) 5-325 MG tablet Take 1 tablet by mouth every 6 (six) hours as needed for severe pain. 01/21/21  Yes Renne Crigler, PA-C  ibuprofen (ADVIL,MOTRIN) 400 MG tablet Take 800-1,200 mg by mouth every 6 (six) hours as needed for moderate pain.    [provider]    Allergies    Patient has no known allergies.  Review of Systems   Review of Systems   Constitutional:  Negative for fever.  HENT:  Positive for dental problem and facial swelling. Negative for ear pain, sore throat and trouble swallowing.   Respiratory:  Negative for shortness of breath and stridor.   Musculoskeletal:  Negative for neck pain.  Skin:  Negative for color change.  Neurological:  Positive for numbness. Negative for headaches.   Physical Exam Updated Vital Signs BP (!) 151/119 (BP Location: Right Arm)   Pulse (!) 56   Temp 100.3 F (37.9 C) (Oral)   Resp 16   Ht 5\' 11"  (1.803 m)   Wt 99.8 kg   SpO2 98%   BMI 30.68 kg/m   Physical Exam Vitals and nursing note reviewed.  Constitutional:      Appearance: He is well-developed.  HENT:     Head: Normocephalic and atraumatic.     Jaw: No trismus.     Right Ear: Tympanic membrane, ear canal and external ear normal.     Left Ear: Tympanic membrane, ear canal and external ear normal.     Nose: Nose normal.     Mouth/Throat:     Dentition: Abnormal dentition. Dental caries present. No dental abscesses.     Pharynx: Uvula midline. No uvula swelling.     Tonsils: No tonsillar abscesses.     Comments: Patient with multiple teeth that are broken to the gumline.  He has erythema of the gums and mild fullness centered around the base of tooth #29.  Area is very  tender.  Mild induration.  No signs of Ludwick's angina.  Sublingual space is soft.  No fullness of the neck.  Full range of motion of the neck. Eyes:     Pupils: Pupils are equal, round, and reactive to light.  Neck:     Comments: No neck swelling or Lugwig's angina Musculoskeletal:     Cervical back: Normal range of motion and neck supple.  Skin:    General: Skin is warm and dry.  Neurological:     Mental Status: He is alert.     Comments: No facial droop or facial weakness noted.  Psychiatric:        Mood and Affect: Mood normal.    ED Results / Procedures / Treatments   Labs (all labs ordered are listed, but only abnormal results are  displayed) Labs Reviewed - No data to display  EKG None  Radiology No results found.  Procedures Procedures   Medications Ordered in ED Medications  acetaminophen (TYLENOL) tablet 1,000 mg (has no administration in time range)  amoxicillin-clavulanate (AUGMENTIN) 875-125 MG per tablet 1 tablet (has no administration in time range)    ED Course  I have reviewed the triage vital signs and the nursing notes.  Pertinent labs & imaging results that were available during my care of the patient were reviewed by me and considered in my medical decision making (see chart for details).  9:53 PM Patient seen and examined. Medications ordered.  Mildly elevated temperature to 100.3 F noted.  Patient does not appear septic.    Vital signs reviewed and are as follows: BP (!) 151/119 (BP Location: Right Arm)   Pulse (!) 56   Temp 100.3 F (37.9 C) (Oral)   Resp 16   Ht 5\' 11"  (1.803 m)   Wt 99.8 kg   SpO2 98%   BMI 30.68 kg/m   Patient counseled on use of narcotic pain medications. Counseled not to combine these medications with others containing tylenol. Urged not to drink alcohol, drive, or perform any other activities that requires focus while taking these medications. The patient verbalizes understanding and agrees with the plan.  Patient counseled to take prescribed medications as directed, return with worsening facial or neck swelling, and to follow-up with their dentist as soon as possible.      MDM Rules/Calculators/A&P                          Patient presents for dental pain and suspected early abscess/plegmon. They do not appear septic. Exam unconcerning for Ludwig's angina or other deep tissue infection in neck and I do not feel that advanced imaging is indicated at this time. Low suspicion for PTA, RPA, epiglottis based on exam.   Patient will be treated for dental infection with antibiotic. Encouraged tylenol/NSAIDs as prescribed or as directed on the packaging for  pain. Encouraged follow-up with a dentist for definitive and long-term management.   Final Clinical Impression(s) / ED Diagnoses Final diagnoses:  Dental infection    Rx / DC Orders ED Discharge Orders          Ordered    amoxicillin-clavulanate (AUGMENTIN) 875-125 MG tablet  Every 12 hours        01/21/21 2147    HYDROcodone-acetaminophen (NORCO/VICODIN) 5-325 MG tablet  Every 6 hours PRN        01/21/21 2147             2148, PA-C 01/21/21 2153  Gwyneth Sprout, MD 01/22/21 [phone]

## 2021-01-21 NOTE — ED Triage Notes (Signed)
Pt reports dental pain for a few days and now endorses pain that radiates to right side of face.

## 2021-01-21 NOTE — ED Notes (Signed)
Pt came back for more pain medicine with his wife. Pt given pain medicine and told not to drive, wife is driving patient home.
